# Patient Record
Sex: Female | Born: 1970 | Race: Black or African American | Hispanic: No | Marital: Single | Smoking: Never smoker
Health system: Southern US, Community
[De-identification: ages and names within clinical notes are randomized; demographics above are authoritative.]

## PROBLEM LIST (undated history)

## (undated) DIAGNOSIS — I493 Ventricular premature depolarization: Secondary | ICD-10-CM

## (undated) DIAGNOSIS — T8859XA Other complications of anesthesia, initial encounter: Secondary | ICD-10-CM

## (undated) DIAGNOSIS — T4145XA Adverse effect of unspecified anesthetic, initial encounter: Secondary | ICD-10-CM

## (undated) HISTORY — PX: CERVICAL CONE BIOPSY: SUR198

---

## 2012-01-29 LAB — HM PAP SMEAR: HM Pap smear: ABNORMAL

## 2012-01-30 ENCOUNTER — Ambulatory Visit: Payer: Self-pay | Admitting: Internal Medicine

## 2012-02-01 LAB — HM MAMMOGRAPHY: HM Mammogram: NORMAL

## 2012-04-07 ENCOUNTER — Emergency Department: Payer: Self-pay | Admitting: Emergency Medicine
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
## 2015-07-06 ENCOUNTER — Encounter: Payer: Self-pay | Admitting: Internal Medicine

## 2015-07-06 DIAGNOSIS — R002 Palpitations: Secondary | ICD-10-CM | POA: Insufficient documentation

## 2015-07-06 DIAGNOSIS — Z8742 Personal history of other diseases of the female genital tract: Secondary | ICD-10-CM | POA: Insufficient documentation

## 2015-07-06 DIAGNOSIS — N926 Irregular menstruation, unspecified: Secondary | ICD-10-CM | POA: Insufficient documentation

## 2015-08-17 ENCOUNTER — Encounter: Payer: Self-pay | Admitting: Internal Medicine

## 2015-10-31 ENCOUNTER — Encounter: Payer: Self-pay | Admitting: Internal Medicine

## 2016-07-23 ENCOUNTER — Encounter: Payer: Self-pay | Admitting: Internal Medicine

## 2016-07-23 DIAGNOSIS — D251 Intramural leiomyoma of uterus: Secondary | ICD-10-CM | POA: Insufficient documentation

## 2016-07-24 ENCOUNTER — Encounter: Payer: Self-pay | Admitting: Internal Medicine

## 2016-07-24 ENCOUNTER — Ambulatory Visit (INDEPENDENT_AMBULATORY_CARE_PROVIDER_SITE_OTHER): Payer: Self-pay | Admitting: Internal Medicine

## 2016-07-24 VITALS — BP 118/86 | HR 78 | Resp 16 | Ht 66.25 in | Wt 235.8 lb

## 2016-07-24 DIAGNOSIS — D251 Intramural leiomyoma of uterus: Secondary | ICD-10-CM

## 2016-07-24 NOTE — Patient Instructions (Signed)
DASH Eating Plan  DASH stands for "Dietary Approaches to Stop Hypertension." The DASH eating plan is a healthy eating plan that has been shown to reduce high blood pressure (hypertension). Additional health benefits may include reducing the risk of type 2 diabetes mellitus, heart disease, and stroke. The DASH eating plan may also help with weight loss.  WHAT DO I NEED TO KNOW ABOUT THE DASH EATING PLAN?  For the DASH eating plan, you will follow these general guidelines:  · Choose foods with a percent daily value for sodium of less than 5% (as listed on the food label).  · Use salt-free seasonings or herbs instead of table salt or sea salt.  · Check with your health care provider or pharmacist before using salt substitutes.  · Eat lower-sodium products, often labeled as "lower sodium" or "no salt added."  · Eat fresh foods.  · Eat more vegetables, fruits, and low-fat dairy products.  · Choose whole grains. Look for the word "whole" as the first word in the ingredient list.  · Choose fish and skinless chicken or turkey more often than red meat. Limit fish, poultry, and meat to 6 oz (170 g) each day.  · Limit sweets, desserts, sugars, and sugary drinks.  · Choose heart-healthy fats.  · Limit cheese to 1 oz (28 g) per day.  · Eat more home-cooked food and less restaurant, buffet, and fast food.  · Limit fried foods.  · Cook foods using methods other than frying.  · Limit canned vegetables. If you do use them, rinse them well to decrease the sodium.  · When eating at a restaurant, ask that your food be prepared with less salt, or no salt if possible.  WHAT FOODS CAN I EAT?  Seek help from a dietitian for individual calorie needs.  Grains  Whole grain or whole wheat bread. Brown rice. Whole grain or whole wheat pasta. Quinoa, bulgur, and whole grain cereals. Low-sodium cereals. Corn or whole wheat flour tortillas. Whole grain cornbread. Whole grain crackers. Low-sodium crackers.  Vegetables  Fresh or frozen vegetables  (raw, steamed, roasted, or grilled). Low-sodium or reduced-sodium tomato and vegetable juices. Low-sodium or reduced-sodium tomato sauce and paste. Low-sodium or reduced-sodium canned vegetables.   Fruits  All fresh, canned (in natural juice), or frozen fruits.  Meat and Other Protein Products  Ground beef (85% or leaner), grass-fed beef, or beef trimmed of fat. Skinless chicken or turkey. Ground chicken or turkey. Pork trimmed of fat. All fish and seafood. Eggs. Dried beans, peas, or lentils. Unsalted nuts and seeds. Unsalted canned beans.  Dairy  Low-fat dairy products, such as skim or 1% milk, 2% or reduced-fat cheeses, low-fat ricotta or cottage cheese, or plain low-fat yogurt. Low-sodium or reduced-sodium cheeses.  Fats and Oils  Tub margarines without trans fats. Light or reduced-fat mayonnaise and salad dressings (reduced sodium). Avocado. Safflower, olive, or canola oils. Natural peanut or almond butter.  Other  Unsalted popcorn and pretzels.  The items listed above may not be a complete list of recommended foods or beverages. Contact your dietitian for more options.  WHAT FOODS ARE NOT RECOMMENDED?  Grains  White bread. White pasta. White rice. Refined cornbread. Bagels and croissants. Crackers that contain trans fat.  Vegetables  Creamed or fried vegetables. Vegetables in a cheese sauce. Regular canned vegetables. Regular canned tomato sauce and paste. Regular tomato and vegetable juices.  Fruits  Dried fruits. Canned fruit in light or heavy syrup. Fruit juice.  Meat and Other Protein   Products  Fatty cuts of meat. Ribs, chicken wings, bacon, sausage, bologna, salami, chitterlings, fatback, hot dogs, bratwurst, and packaged luncheon meats. Salted nuts and seeds. Canned beans with salt.  Dairy  Whole or 2% milk, cream, half-and-half, and cream cheese. Whole-fat or sweetened yogurt. Full-fat cheeses or blue cheese. Nondairy creamers and whipped toppings. Processed cheese, cheese spreads, or cheese  curds.  Condiments  Onion and garlic salt, seasoned salt, table salt, and sea salt. Canned and packaged gravies. Worcestershire sauce. Tartar sauce. Barbecue sauce. Teriyaki sauce. Soy sauce, including reduced sodium. Steak sauce. Fish sauce. Oyster sauce. Cocktail sauce. Horseradish. Ketchup and mustard. Meat flavorings and tenderizers. Bouillon cubes. Hot sauce. Tabasco sauce. Marinades. Taco seasonings. Relishes.  Fats and Oils  Butter, stick margarine, lard, shortening, ghee, and bacon fat. Coconut, palm kernel, or palm oils. Regular salad dressings.  Other  Pickles and olives. Salted popcorn and pretzels.  The items listed above may not be a complete list of foods and beverages to avoid. Contact your dietitian for more information.  WHERE CAN I FIND MORE INFORMATION?  National Heart, Lung, and [employer] Institute: www.nhlbi.nih.gov/health/health-topics/topics/dash/     This information is not intended to replace advice given to you by your health care provider. Make sure you discuss any questions you have with your health care provider.     Document Released: 12/06/2011 Document Revised: 01/07/2015 Document Reviewed: 10/21/2013  Elsevier Interactive Patient Education ©2016 Elsevier Inc.

## 2016-07-24 NOTE — Progress Notes (Signed)
    Date:  07/24/2016   Name:  Cynthia Cooley   DOB:  07/25/1971   MRN:  [id number]   Chief Complaint: Tumor (Has tumor in uterus that she wants removed. Was going to do this at [place] through charity care but not in that county so was denied. Patient has abdominal swelling and urinating several times daily because the tumor is pressing on the other organs. )  Findings: The uterus measures 10.9 x 9.7 x 10.2 cm. Again seen is a dominant intramural or subserosal fibroid in the anterior fundus/body measuring 6.9 x 6.0 x 5.6 cm, previously 7.3 x 6.5 x 6.8 cm. The endometrium measures 1.1 cm. No free fluid in the pelvis.  The right ovary measures 3.7 x 1.6 x 2.6 cm and contains a 1.4 cm probable hemorrhagic cyst. Blood flow is documented within the ovary.   The left ovary measures 2.6 x 1.6 x 1.5 cm and is normal in appearance. No ovarian mass seen. Blood flow is documented within the ovary.   Impression: 1. The left ovary is normal in appearance. No mass is seen. 2. Unchanged 6.9 cm intramural or subserosal fibroid in the anterior fundus/body.  Electronically Signed [id number] [place], MD Electronically Signed on:01/20/2016 8:31 AM  Review of Systems  Respiratory: Negative for chest tightness and stridor.   Gastrointestinal: Positive for abdominal distention.  Genitourinary: Positive for frequency, urgency and vaginal bleeding (regular menses). Negative for menstrual problem and pelvic pain.    Patient Active Problem List   Diagnosis Date Noted  . Uterine fibroid 07/23/2016  . Awareness of heartbeats 07/06/2015  . H/O abnormal cervical Papanicolaou smear 07/06/2015  . Abnormal menses 07/06/2015    Prior to Admission medications   Not on File    No Known Allergies  Past Surgical History:  Procedure Laterality Date  . CERVICAL CONE BIOPSY  1989, 2005    Social History  Substance Use Topics  . Smoking status: Never Smoker  . Smokeless tobacco: Not on file  . Alcohol  use No     Medication list has been reviewed and updated.   Physical Exam  Constitutional: She is oriented to person, place, and time. She appears well-developed. No distress.  HENT:  Head: Normocephalic and atraumatic.  Cardiovascular: Normal rate, regular rhythm and normal heart sounds.   Pulmonary/Chest: Effort normal and breath sounds normal. No respiratory distress.  Abdominal: Soft. Bowel sounds are normal. She exhibits no distension and no mass. There is no tenderness. There is no rebound and no guarding.  Musculoskeletal: Normal range of motion. She exhibits no edema or tenderness.  Neurological: She is alert and oriented to person, place, and time.  Skin: Skin is warm and dry. No rash noted.  Psychiatric: She has a normal mood and affect. Her behavior is normal. Thought content normal.  Nursing note and vitals reviewed.   BP 118/86 (BP Location: Right Arm, Patient Position: Sitting, Cuff Size: Large)   Pulse 78   Resp 16   Ht 5' 6.25" (1.683 m)   Wt 235 lb 12.8 oz (107 kg)   LMP 06/23/2016 (Approximate)   SpO2 99%   BMI 37.77 kg/m   Assessment and Plan: 1. Intramural leiomyoma of uterus With pelvic pressure and urinary sx - Ambulatory referral to Gynecology   Halina Maidens, MD [facility]  07/24/2016

## 2017-02-28 ENCOUNTER — Other Ambulatory Visit: Payer: Self-pay | Admitting: Obstetrics & Gynecology

## 2017-02-28 DIAGNOSIS — D251 Intramural leiomyoma of uterus: Secondary | ICD-10-CM

## 2017-03-08 ENCOUNTER — Other Ambulatory Visit: Payer: Self-pay

## 2017-03-08 ENCOUNTER — Ambulatory Visit: Payer: Self-pay | Admitting: Obstetrics & Gynecology

## 2017-04-30 ENCOUNTER — Ambulatory Visit (INDEPENDENT_AMBULATORY_CARE_PROVIDER_SITE_OTHER): Payer: Self-pay

## 2017-04-30 ENCOUNTER — Telehealth: Payer: Self-pay

## 2017-04-30 DIAGNOSIS — D251 Intramural leiomyoma of uterus: Secondary | ICD-10-CM

## 2017-04-30 NOTE — Telephone Encounter (Signed)
Pt transferred to me by S Paschal. Pt states she had an apt a few wks ago that she had to cancel d/t work. She is scheduled for u/s & f/u w/[facility] on 05/20/17, but she started having pain in her uterus on Saturday that has progressively gotten worse. She believes it is coming from her fibroid. She isn't bleeding, but the pain is causing her to have difficulty walking & standing & is causing back pain too. It is bad enough that she may have to call out of work. Pt requesting if there is any way she can have an u/s today to get this checked out?

## 2017-04-30 NOTE — Telephone Encounter (Signed)
Pt calling.  She has a fibroid that she said the doctor didn't want to take out when she saw him last year.  PH's not does not state this.  If fact she was suppose to return in 2wks for u/s and f/u visit which she cancelled.  Pt had appts sched 03/08/17 which was also cancelled.  She states it is time to get this out of her.  Her qualify of life has changed b/c of it - not feeling good at all, has a lot of pulling to the right and downward, back hurts.  Left detailed msg that I wasn't able to pull any strings to get pt in sooner.  We do have a cancellation list however the lady in charge of it is out of the office this pm and will be back tomorrow.  Pt can call tomorrow to be sure she is on cancellation list.

## 2017-04-30 NOTE — Telephone Encounter (Signed)
Called pt. Notified we have a 2:30 slot for U/S today & ABC will review results & consult w/SDJ if needed & discuss w/pt. Per pt, she is in [place] & will have to see if her [facility] will let her off. She asked if we have anything 1st thing in the morning. No opening til noon tomorrow. Offered 4:30 today also. Pt will call us back to let us know if she will be able to make that apt today. 4:30 U/S today placed on hold.

## 2017-04-30 NOTE — Telephone Encounter (Signed)
Per ABC, she reviewed U/S from today. No change in Fibroid. Message to [facility] for his review.

## 2017-05-01 NOTE — Telephone Encounter (Signed)
Spoke w/pt. Notified of [place] comments. Pt will discuss at 05/20/17 visit.

## 2017-05-01 NOTE — Telephone Encounter (Signed)
Same size fibroid as last year, may be 'degenerating' thus causing pain, not an emergency and will see patient as scheduled.  (no repeat US needed that day, just a visit).

## 2017-05-20 ENCOUNTER — Other Ambulatory Visit: Payer: Medicaid Other

## 2017-05-20 ENCOUNTER — Ambulatory Visit: Payer: Self-pay | Admitting: Obstetrics & Gynecology

## 2017-06-24 ENCOUNTER — Encounter: Payer: Self-pay | Admitting: Obstetrics & Gynecology

## 2017-06-24 ENCOUNTER — Ambulatory Visit (INDEPENDENT_AMBULATORY_CARE_PROVIDER_SITE_OTHER): Payer: Self-pay | Admitting: Obstetrics & Gynecology

## 2017-06-24 VITALS — BP 120/80 | HR 83 | Ht 66.0 in | Wt 240.0 lb

## 2017-06-24 DIAGNOSIS — D251 Intramural leiomyoma of uterus: Secondary | ICD-10-CM

## 2017-06-24 DIAGNOSIS — R102 Pelvic and perineal pain: Secondary | ICD-10-CM

## 2017-06-24 NOTE — Progress Notes (Signed)
PRE-OPERATIVE HISTORY AND PHYSICAL EXAM  HPI:  Cynthia Cooley is a 46 y.o. [id number] Patient's last menstrual period was 06/22/2017.; she is being admitted for surgery related to fibroids and pelvic pain. 7cm fibroid by [place], increasing in pain and pressure.  PMHx: History reviewed. No pertinent past medical history. Past Surgical History:  Procedure Laterality Date  . CERVICAL CONE BIOPSY  1989, 2005   Family History  Problem Relation Age of Onset  . Cancer Mother   . Diabetes Mother   . Hyperlipidemia Mother   . Glaucoma Father    Social History  Substance Use Topics  . Smoking status: Never Smoker  . Smokeless tobacco: Never Used  . Alcohol use No   No outpatient prescriptions have been marked as taking for the 06/24/17 encounter (Office Visit) with Gae Dry, MD.   Allergies: Patient has no known allergies.  Review of Systems  Constitutional: Negative for chills, fever and malaise/fatigue.  HENT: Negative for congestion, sinus pain and sore throat.   Eyes: Negative for blurred vision and pain.  Respiratory: Negative for cough and wheezing.   Cardiovascular: Negative for chest pain and leg swelling.  Gastrointestinal: Negative for abdominal pain, constipation, diarrhea, heartburn, nausea and vomiting.  Genitourinary: Negative for dysuria, frequency, hematuria and urgency.  Musculoskeletal: Negative for back pain, joint pain, myalgias and neck pain.  Skin: Negative for itching and rash.  Neurological: Negative for dizziness, tremors and weakness.  Endo/Heme/Allergies: Does not bruise/bleed easily.  Psychiatric/Behavioral: Negative for depression. The patient is not nervous/anxious and does not have insomnia.     Objective: BP 120/80   Pulse 83   Ht 5\' 6"  (1.676 m)   Wt 240 lb (108.9 kg)   LMP 06/22/2017   BMI 38.74 kg/m   Filed Weights   06/24/17 0817  Weight: 240 lb (108.9 kg)   Physical Exam  Constitutional: She is oriented to person, place, and  time. She appears well-developed and well-nourished. No distress.  Genitourinary: Rectum normal, vagina normal and uterus normal. Pelvic exam was performed with patient supine. There is no rash or lesion on the right labia. There is no rash or lesion on the left labia. Vagina exhibits no lesion. No bleeding in the vagina. Right adnexum does not display mass and does not display tenderness. Left adnexum does not display mass and does not display tenderness. Cervix does not exhibit motion tenderness, lesion, friability or polyp.   Uterus is mobile and midaxial. Uterus is not enlarged or exhibiting a mass.  Genitourinary Comments: 8 week size uterus  HENT:  Head: Normocephalic and atraumatic. Head is without laceration.  Right Ear: Hearing normal.  Left Ear: Hearing normal.  Nose: No epistaxis.  No foreign bodies.  Mouth/Throat: Uvula is midline, oropharynx is clear and moist and mucous membranes are normal.  Eyes: Pupils are equal, round, and reactive to light.  Neck: Normal range of motion. Neck supple. No thyromegaly present.  Cardiovascular: Normal rate and regular rhythm.  Exam reveals no gallop and no friction rub.   No murmur heard. Pulmonary/Chest: Effort normal and breath sounds normal. No respiratory distress. She has no wheezes. Right breast exhibits no mass, no skin change and no tenderness. Left breast exhibits no mass, no skin change and no tenderness.  Abdominal: Soft. Bowel sounds are normal. She exhibits no distension. There is no tenderness. There is no rebound.  Musculoskeletal: Normal range of motion.  Neurological: She is alert and oriented to person, place, and time.  No cranial nerve deficit.  Skin: Skin is warm and dry.  Psychiatric: She has a normal mood and affect. Judgment normal.  Vitals reviewed.   Assessment: 1. Intramural leiomyoma of uterus   2. Acute pelvic pain, female   Plan and desire forTLH, BS (preservation of ovaries). Options of myomectomy, [place], and  Lupron discussed.  I have had a careful discussion with this patient about all the options available and the risk/benefits of each. I have fully informed this patient that surgery may subject her to a variety of discomforts and risks: She understands that most patients have surgery with little difficulty, but problems can happen ranging from minor to fatal. These include nausea, vomiting, pain, bleeding, infection, poor healing, hernia, or formation of adhesions. Unexpected reactions may occur from any drug or anesthetic given. Unintended injury may occur to other pelvic or abdominal structures such as Fallopian tubes, ovaries, bladder, ureter (tube from kidney to bladder), or bowel. Nerves going from the pelvis to the legs may be injured. Any such injury may require immediate or later additional surgery to correct the problem. Excessive blood loss requiring transfusion is very unlikely but possible. Dangerous blood clots may form in the legs or lungs. Physical and sexual activity will be restricted in varying degrees for an indeterminate period of time but most often 2-6 weeks.  Finally, she understands that it is impossible to list every possible undesirable effect and that the condition for which surgery is done is not always cured or significantly improved, and in rare cases may be even worse.Ample time was given to answer all questions.  Barnett Applebaum, MD, [facility], [facility] 06/24/2017  11:58 AM

## 2017-06-24 NOTE — Patient Instructions (Signed)
Total Laparoscopic Hysterectomy °A total laparoscopic hysterectomy is a minimally invasive surgery to remove your uterus and cervix. This surgery is performed by making several small cuts (incisions) in your abdomen. It can also be done with a thin, lighted tube (laparoscope) inserted into two small incisions in your lower abdomen. Your fallopian tubes and ovaries can be removed (bilateral salpingo-oophorectomy) during this surgery as well. Benefits of minimally invasive surgery include: °· Less pain. °· Less risk of blood loss. °· Less risk of infection. °· Quicker return to normal activities. ° °Tell a health care provider about: °· Any allergies you have. °· All medicines you are taking, including vitamins, herbs, eye drops, creams, and over-the-counter medicines. °· Any problems you or family members have had with anesthetic medicines. °· Any blood disorders you have. °· Any surgeries you have had. °· Any medical conditions you have. °What are the risks? °Generally, this is a safe procedure. However, as with any procedure, complications can occur. Possible complications include: °· Bleeding. °· Blood clots in the legs or lung. °· Infection. °· Injury to surrounding organs. °· Problems with anesthesia. °· Early menopause symptoms (hot flashes, night sweats, insomnia). °· Risk of conversion to an open abdominal incision. ° °What happens before the procedure? °· Ask your health care provider about changing or stopping your regular medicines. °· Do not take aspirin or blood thinners (anticoagulants) for 1 week before the surgery or as told by your health care provider. °· Do not eat or drink anything for 8 hours before the surgery or as told by your health care provider. °· Quit smoking if you smoke. °· Arrange for a ride home after surgery and for someone to help you at home during recovery. °What happens during the procedure? °· You will be given antibiotic medicine. °· An IV tube will be placed in your arm. You  will be given medicine to make you sleep (general anesthetic). °· A gas (carbon dioxide) will be used to inflate your abdomen. This will allow your surgeon to look inside your abdomen, perform your surgery, and treat any other problems found if necessary. °· Three or four small incisions (often less than 1/2 inch) will be made in your abdomen. One of these incisions will be made in the area of your belly button (navel). The laparoscope will be inserted into the incision. Your surgeon will look through the laparoscope while doing your procedure. °· Other surgical instruments will be inserted through the other incisions. °· Your uterus may be removed through your vagina or cut into small pieces and removed through the small incisions. °· Your incisions will be closed. °What happens after the procedure? °· The gas will be released from inside your abdomen. °· You will be taken to the recovery area where a nurse will watch and check your progress. Once you are awake, stable, and taking fluids well, without other problems, you will return to your room or be allowed to go home. °· There is usually minimal discomfort following the surgery because the incisions are so small. °· You will be given pain medicine while you are in the hospital and for when you go home. °This information is not intended to replace advice given to you by your health care provider. Make sure you discuss any questions you have with your health care provider. °Document Released: 10/14/2007 Document Revised: 05/24/2016 Document Reviewed: 07/07/2013 °Elsevier Interactive Patient Education © 2017 Elsevier Inc. ° °

## 2017-06-24 NOTE — Progress Notes (Signed)
HPI: Uterine Fibroids Patient presents with uterine fibroids. Periods are regular every 28-30 days, lasting 5 days. Dysmenorrhea:severe, occurring throughout menses. Cyclic symptoms include bloating. No intermenstrual bleeding, spotting, or discharge.  Ultrasound demonstrates 7 cm fibroid These findings are Pelvis abnormal as fibroidis present and attributal to sx's  PMHx: She  has no past medical history on file. Also,  has a past surgical history that includes Cervical cone biopsy (1989, 2005)., family history includes Cancer in her mother; Diabetes in her mother; Glaucoma in her father; Hyperlipidemia in her mother.,  reports that she has never smoked. She has never used smokeless tobacco. She reports that she does not drink alcohol or use drugs.  She currently has no medications in their medication list. Also, has No Known Allergies.  Review of Systems  Constitutional: Negative for chills, fever and malaise/fatigue.  HENT: Negative for congestion, sinus pain and sore throat.   Eyes: Negative for blurred vision and pain.  Respiratory: Negative for cough and wheezing.   Cardiovascular: Negative for chest pain and leg swelling.  Gastrointestinal: Negative for abdominal pain, constipation, diarrhea, heartburn, nausea and vomiting.  Genitourinary: Negative for dysuria, frequency, hematuria and urgency.  Musculoskeletal: Negative for back pain, joint pain, myalgias and neck pain.  Skin: Negative for itching and rash.  Neurological: Negative for dizziness, tremors and weakness.  Endo/Heme/Allergies: Does not bruise/bleed easily.  Psychiatric/Behavioral: Negative for depression. The patient is not nervous/anxious and does not have insomnia.    Objective: BP 120/80   Pulse 83   Ht 5\' 6"  (1.676 m)   Wt 240 lb (108.9 kg)   LMP 06/22/2017   BMI 38.74 kg/m   Physical examination Constitutional NAD, Conversant  Skin No rashes, lesions or ulceration.   Extremities: Moves all  appropriately.  Normal ROM for age. No lymphadenopathy.  Neuro: Grossly intact  Psych: Oriented to PPT.  Normal mood. Normal affect.   Assessment:  Intramural leiomyoma of uterus  Options of myomectomy, [place], Lupron, hormonal tx of sx's all discussed w patient. Info gv.  She desires to have TLH, BS done w preservation of ovaries.  I have had a careful discussion with this patient about all the options available and the risk/benefits of each. I have fully informed this patient that a hysterectomy may subject her to a variety of discomforts and risks: She understands that most patients have surgery with little difficulty, but problems can happen ranging from minor to fatal. These include nausea, vomiting, pain, bleeding, infection, poor healing, hernia, or formation of adhesions. Unexpected reactions may occur from any drug or anesthetic given. Unintended injury may occur to other pelvic or abdominal structures such as Fallopian tubes, ovaries, bladder, ureter (tube from kidney to bladder), or bowel. Nerves going from the pelvis to the legs may be injured. Any such injury may require immediate or later additional surgery to correct the problem. Excessive blood loss requiring transfusion is very unlikely but possible. Dangerous blood clots may form in the legs or lungs. Physical and sexual activity will be restricted in varying degrees for an indeterminate period of time but most often 2-4 weeks. She understands that the plan is to do this laparoscopically, however, there is a chance that this will need to be performed via a larger incision. She will most likely be hospitalized overnight. Finally, she understands that it is impossible to list every possible undesirable effect and that the condition for which surgery is done is not always cured or significantly improved, and in rare cases may be  even worse. I have also counseled her extensively about the pros and cons of ovarian conservation versus removal. Ample  time was given to answer all questions.  Barnett Applebaum, MD, [facility], [facility] 06/24/2017  8:44 AM

## 2017-06-24 NOTE — Addendum Note (Signed)
Addended by: Gae Dry on: 06/24/2017 12:00 PM   Modules accepted: Orders, SmartSet

## 2017-07-17 ENCOUNTER — Encounter
Admission: RE | Admit: 2017-07-17 | Discharge: 2017-07-17 | Disposition: A | Discharge status: Home / Self Care | Payer: Self-pay | Source: Ambulatory Visit | Attending: Obstetrics & Gynecology | Admitting: Obstetrics & Gynecology

## 2017-07-17 HISTORY — DX: Other complications of anesthesia, initial encounter: T88.59XA

## 2017-07-17 HISTORY — DX: Adverse effect of unspecified anesthetic, initial encounter: T41.45XA

## 2017-07-17 HISTORY — DX: Ventricular premature depolarization: I49.3

## 2017-07-17 NOTE — Patient Instructions (Signed)
  Your procedure is scheduled on: 07-23-17 TUESDAY Report to Same Day Surgery 2nd floor medical mall [facility] Entrance-take elevator on left to 2nd floor.  Check in with surgery information desk.) To find out your arrival time please call [phone] between 1PM - 3PM on 07-22-17 MONDAY  Remember: Instructions that are not followed completely may result in serious medical risk, up to and including death, or upon the discretion of your surgeon and anesthesiologist your surgery may need to be rescheduled.    _x___ 1. Do not eat food or drink liquids after midnight. No gum chewing or hard candies.     __x__ 2. No Alcohol for 24 hours before or after surgery.   __x__3. No Smoking for 24 prior to surgery.   ____  4. Bring all medications with you on the day of surgery if instructed.    __x__ 5. Notify your doctor if there is any change in your medical condition     (cold, fever, infections).     Do not wear jewelry, make-up, hairpins, clips or nail polish.  Do not wear lotions, powders, or perfumes. You may wear deodorant.  Do not shave 48 hours prior to surgery. Men may shave face and neck.  Do not bring valuables to the hospital.    [facility] is not responsible for any belongings or valuables.               Contacts, dentures or bridgework may not be worn into surgery.  Leave your suitcase in the car. After surgery it may be brought to your room.  For patients admitted to the hospital, discharge time is determined by your treatment team.   Patients discharged the day of surgery will not be allowed to drive home.  You will need someone to drive you home and stay with you the night of your procedure.    Please read over the following fact sheets that you were given:     ____ Take anti-hypertensive (unless it includes a diuretic), cardiac, seizure, asthma,     anti-reflux and psychiatric medicines. These include:  1. NONE  2.  3.  4.  5.  6.  ____Fleets enema or Magnesium  Citrate as directed.   _x___ Use CHG Soap or sage wipes as directed on instruction sheet   ____ Use inhalers on the day of surgery and bring to hospital day of surgery  ____ Stop Metformin and Janumet 2 days prior to surgery.    ____ Take 1/2 of usual insulin dose the night before surgery and none on the morning surgery.   ____ Follow recommendations from Cardiologist, Pulmonologist or PCP regarding  stopping Aspirin, Coumadin, Pllavix ,Eliquis, Effient, or Pradaxa, and Pletal.  X____Stop Anti-inflammatories such as Advil, Aleve, Ibuprofen, Motrin, Naproxen, Naprosyn, Goodies powders or aspirin products NOW- OK to take Tylenol .   ____ Stop supplements until after surgery.     ____ Bring C-Pap to the hospital.

## 2017-07-22 ENCOUNTER — Encounter
Admission: RE | Admit: 2017-07-22 | Discharge: 2017-07-22 | Disposition: A | Discharge status: Home / Self Care | Payer: Medicaid Other | Source: Ambulatory Visit | Attending: Obstetrics & Gynecology | Admitting: Obstetrics & Gynecology

## 2017-07-22 DIAGNOSIS — R102 Pelvic and perineal pain: Secondary | ICD-10-CM | POA: Insufficient documentation

## 2017-07-22 DIAGNOSIS — Z01812 Encounter for preprocedural laboratory examination: Secondary | ICD-10-CM | POA: Insufficient documentation

## 2017-07-22 DIAGNOSIS — D251 Intramural leiomyoma of uterus: Secondary | ICD-10-CM | POA: Diagnosis not present

## 2017-07-22 LAB — CBC
HEMATOCRIT: 36.1 % (ref 35.0–47.0)
Hemoglobin: 12.2 g/dL (ref 12.0–16.0)
MCH: 29.3 pg (ref 26.0–34.0)
MCHC: 33.9 g/dL (ref 32.0–36.0)
MCV: 86.5 fL (ref 80.0–100.0)
Platelets: 188 10*3/uL (ref 150–440)
RBC: 4.17 MIL/uL (ref 3.80–5.20)
RDW: 13.9 % (ref 11.5–14.5)
WBC: 2.3 10*3/uL — AB (ref 3.6–11.0)

## 2017-07-22 MED ORDER — CEFOXITIN SODIUM-DEXTROSE 2-2.2 GM-% IV SOLR (PREMIX)
2.0000 g | INTRAVENOUS | Status: AC
  Administered 2017-07-23: 2 g via INTRAVENOUS

## 2017-07-22 NOTE — Pre-Procedure Instructions (Signed)
Spoke with Ginger Carne RN about WBC of 2.3.. She said Anesthesia would not have a problem with that #. Faxed the preop labs to Dr Kenton Kingfisher' office.  Izora Gala called and said that Dr Kenton Kingfisher is ok with WBC of 2.3

## 2017-07-23 ENCOUNTER — Ambulatory Visit: Payer: Medicaid Other | Admitting: Anesthesiology

## 2017-07-23 ENCOUNTER — Encounter
Admission: RE | Disposition: A | Discharge status: Home / Self Care | Payer: Self-pay | Source: Ambulatory Visit | Attending: Obstetrics & Gynecology

## 2017-07-23 ENCOUNTER — Observation Stay
Admission: RE | Admit: 2017-07-23 | Discharge: 2017-07-24 | Disposition: A | Discharge status: Home / Self Care | Payer: Medicaid Other | Source: Ambulatory Visit | Attending: Obstetrics & Gynecology | Admitting: Obstetrics & Gynecology

## 2017-07-23 DIAGNOSIS — I493 Ventricular premature depolarization: Secondary | ICD-10-CM | POA: Insufficient documentation

## 2017-07-23 DIAGNOSIS — Z884 Allergy status to anesthetic agent status: Secondary | ICD-10-CM | POA: Diagnosis not present

## 2017-07-23 DIAGNOSIS — D250 Submucous leiomyoma of uterus: Secondary | ICD-10-CM

## 2017-07-23 DIAGNOSIS — N946 Dysmenorrhea, unspecified: Secondary | ICD-10-CM | POA: Insufficient documentation

## 2017-07-23 DIAGNOSIS — D259 Leiomyoma of uterus, unspecified: Secondary | ICD-10-CM | POA: Diagnosis present

## 2017-07-23 DIAGNOSIS — R51 Headache: Secondary | ICD-10-CM | POA: Diagnosis not present

## 2017-07-23 DIAGNOSIS — D252 Subserosal leiomyoma of uterus: Secondary | ICD-10-CM | POA: Diagnosis not present

## 2017-07-23 DIAGNOSIS — Z6838 Body mass index (BMI) 38.0-38.9, adult: Secondary | ICD-10-CM | POA: Diagnosis not present

## 2017-07-23 DIAGNOSIS — D251 Intramural leiomyoma of uterus: Secondary | ICD-10-CM | POA: Diagnosis not present

## 2017-07-23 DIAGNOSIS — N926 Irregular menstruation, unspecified: Secondary | ICD-10-CM | POA: Diagnosis present

## 2017-07-23 HISTORY — PX: CYSTOSCOPY: SHX5120

## 2017-07-23 HISTORY — PX: LAPAROSCOPIC HYSTERECTOMY: SHX1926

## 2017-07-23 LAB — POCT PREGNANCY, URINE: PREG TEST UR: NEGATIVE

## 2017-07-23 SURGERY — HYSTERECTOMY, TOTAL, LAPAROSCOPIC
Anesthesia: General | Wound class: Clean Contaminated

## 2017-07-23 MED ORDER — LACTATED RINGERS IR SOLN
Status: DC | PRN
  Administered 2017-07-23: 200 mL

## 2017-07-23 MED ORDER — ACETAMINOPHEN 325 MG PO TABS: 650.0000 mg | ORAL_TABLET | ORAL | Status: DC | PRN

## 2017-07-23 MED ORDER — SUGAMMADEX SODIUM 500 MG/5ML IV SOLN
INTRAVENOUS | Status: AC
  Filled 2017-07-23: qty 5

## 2017-07-23 MED ORDER — FENTANYL CITRATE (PF) 100 MCG/2ML IJ SOLN
INTRAMUSCULAR | Status: AC
  Filled 2017-07-23: qty 2

## 2017-07-23 MED ORDER — DEXAMETHASONE SODIUM PHOSPHATE 10 MG/ML IJ SOLN
INTRAMUSCULAR | Status: AC
  Filled 2017-07-23: qty 1

## 2017-07-23 MED ORDER — OXYCODONE-ACETAMINOPHEN 5-325 MG PO TABS
ORAL_TABLET | ORAL | Status: AC
  Administered 2017-07-23: 1 via ORAL
  Filled 2017-07-23: qty 1

## 2017-07-23 MED ORDER — LACTATED RINGERS IV SOLN: Freq: Once | INTRAVENOUS | Status: DC

## 2017-07-23 MED ORDER — ONDANSETRON HCL 4 MG/2ML IJ SOLN
4.0000 mg | Freq: Four times a day (QID) | INTRAMUSCULAR | Status: DC | PRN
  Filled 2017-07-23: qty 2

## 2017-07-23 MED ORDER — FENTANYL CITRATE (PF) 100 MCG/2ML IJ SOLN
25.0000 ug | INTRAMUSCULAR | Status: DC | PRN
  Administered 2017-07-23 (×4): 25 ug via INTRAVENOUS

## 2017-07-23 MED ORDER — OXYCODONE-ACETAMINOPHEN 5-325 MG PO TABS
1.0000 | ORAL_TABLET | Freq: Four times a day (QID) | ORAL | Status: DC | PRN
  Administered 2017-07-23 (×2): 1 via ORAL

## 2017-07-23 MED ORDER — PROPOFOL 10 MG/ML IV BOLUS
INTRAVENOUS | Status: AC
  Filled 2017-07-23: qty 20

## 2017-07-23 MED ORDER — PROMETHAZINE HCL 25 MG/ML IJ SOLN: 6.2500 mg | INTRAMUSCULAR | Status: DC | PRN

## 2017-07-23 MED ORDER — MIDAZOLAM HCL 2 MG/2ML IJ SOLN
INTRAMUSCULAR | Status: DC | PRN
  Administered 2017-07-23: 2 mg via INTRAVENOUS

## 2017-07-23 MED ORDER — SOD CITRATE-CITRIC ACID 500-334 MG/5ML PO SOLN: 30.0000 mL | ORAL | Status: DC

## 2017-07-23 MED ORDER — OXYCODONE-ACETAMINOPHEN 5-325 MG PO TABS: 1.0000 | ORAL_TABLET | Freq: Four times a day (QID) | ORAL | 0 refills | Status: AC | PRN

## 2017-07-23 MED ORDER — ROCURONIUM BROMIDE 100 MG/10ML IV SOLN
INTRAVENOUS | Status: DC | PRN
Start: 2017-07-23 — End: 2017-07-23
  Administered 2017-07-23: 10 mg via INTRAVENOUS
  Administered 2017-07-23: 50 mg via INTRAVENOUS

## 2017-07-23 MED ORDER — ACETAMINOPHEN 650 MG RE SUPP
650.0000 mg | RECTAL | Status: DC | PRN
  Filled 2017-07-23: qty 1

## 2017-07-23 MED ORDER — PROPOFOL 10 MG/ML IV BOLUS
INTRAVENOUS | Status: DC | PRN
  Administered 2017-07-23: 200 mg via INTRAVENOUS

## 2017-07-23 MED ORDER — FAMOTIDINE 20 MG PO TABS
ORAL_TABLET | ORAL | Status: AC
  Administered 2017-07-23: 20 mg via ORAL
  Filled 2017-07-23: qty 1

## 2017-07-23 MED ORDER — MORPHINE SULFATE (PF) 4 MG/ML IV SOLN
1.0000 mg | INTRAVENOUS | Status: DC | PRN
Start: 2017-07-23 — End: 2017-07-23

## 2017-07-23 MED ORDER — FLUORESCEIN SODIUM 10 % IV SOLN
INTRAVENOUS | Status: DC | PRN
  Administered 2017-07-23: 50 mg via INTRAVENOUS

## 2017-07-23 MED ORDER — FAMOTIDINE 20 MG PO TABS
20.0000 mg | ORAL_TABLET | Freq: Once | ORAL | Status: AC
  Administered 2017-07-23: 20 mg via ORAL

## 2017-07-23 MED ORDER — BISACODYL 10 MG RE SUPP: 10.0000 mg | Freq: Every day | RECTAL | Status: DC | PRN

## 2017-07-23 MED ORDER — SENNOSIDES-DOCUSATE SODIUM 8.6-50 MG PO TABS: 1.0000 | ORAL_TABLET | Freq: Every evening | ORAL | Status: DC | PRN

## 2017-07-23 MED ORDER — ONDANSETRON HCL 4 MG PO TABS
4.0000 mg | ORAL_TABLET | Freq: Four times a day (QID) | ORAL | Status: DC | PRN
  Administered 2017-07-23: 4 mg via ORAL
  Filled 2017-07-23: qty 1

## 2017-07-23 MED ORDER — SUGAMMADEX SODIUM 500 MG/5ML IV SOLN
INTRAVENOUS | Status: DC | PRN
  Administered 2017-07-23: 220 mg via INTRAVENOUS

## 2017-07-23 MED ORDER — ACETAMINOPHEN NICU IV SYRINGE 10 MG/ML
INTRAVENOUS | Status: AC
  Filled 2017-07-23: qty 1

## 2017-07-23 MED ORDER — DEXAMETHASONE SODIUM PHOSPHATE 10 MG/ML IJ SOLN
INTRAMUSCULAR | Status: DC | PRN
  Administered 2017-07-23: 10 mg via INTRAVENOUS

## 2017-07-23 MED ORDER — FLUORESCEIN SODIUM 10 % IV SOLN
INTRAVENOUS | Status: AC
  Filled 2017-07-23: qty 5

## 2017-07-23 MED ORDER — ONDANSETRON HCL 4 MG/2ML IJ SOLN
INTRAMUSCULAR | Status: DC | PRN
  Administered 2017-07-23: 4 mg via INTRAVENOUS

## 2017-07-23 MED ORDER — OXYCODONE-ACETAMINOPHEN 5-325 MG PO TABS
1.0000 | ORAL_TABLET | ORAL | Status: DC | PRN
  Administered 2017-07-24: 1 via ORAL
  Filled 2017-07-23 (×2): qty 2

## 2017-07-23 MED ORDER — ROCURONIUM BROMIDE 50 MG/5ML IV SOLN
INTRAVENOUS | Status: AC
  Filled 2017-07-23: qty 1

## 2017-07-23 MED ORDER — ONDANSETRON HCL 4 MG/2ML IJ SOLN
INTRAMUSCULAR | Status: AC
  Filled 2017-07-23: qty 2

## 2017-07-23 MED ORDER — SIMETHICONE 80 MG PO CHEW: 80.0000 mg | CHEWABLE_TABLET | Freq: Four times a day (QID) | ORAL | Status: DC | PRN

## 2017-07-23 MED ORDER — SUCCINYLCHOLINE CHLORIDE 20 MG/ML IJ SOLN
INTRAMUSCULAR | Status: DC | PRN
  Administered 2017-07-23: 120 mg via INTRAVENOUS

## 2017-07-23 MED ORDER — MIDAZOLAM HCL 2 MG/2ML IJ SOLN
INTRAMUSCULAR | Status: AC
  Filled 2017-07-23: qty 2

## 2017-07-23 MED ORDER — DOCUSATE SODIUM 100 MG PO CAPS
100.0000 mg | ORAL_CAPSULE | Freq: Two times a day (BID) | ORAL | Status: DC
  Administered 2017-07-24: 100 mg via ORAL
  Filled 2017-07-23 (×2): qty 1

## 2017-07-23 MED ORDER — KETOROLAC TROMETHAMINE 30 MG/ML IJ SOLN
INTRAMUSCULAR | Status: DC | PRN
  Administered 2017-07-23: 30 mg via INTRAVENOUS

## 2017-07-23 MED ORDER — SUCCINYLCHOLINE CHLORIDE 20 MG/ML IJ SOLN
INTRAMUSCULAR | Status: AC
  Filled 2017-07-23: qty 1

## 2017-07-23 MED ORDER — ACETAMINOPHEN 10 MG/ML IV SOLN
INTRAVENOUS | Status: DC | PRN
  Administered 2017-07-23: 1000 mg via INTRAVENOUS

## 2017-07-23 MED ORDER — LACTATED RINGERS IV SOLN
INTRAVENOUS | Status: DC
  Administered 2017-07-23 (×2): via INTRAVENOUS

## 2017-07-23 MED ORDER — LACTATED RINGERS IV SOLN: INTRAVENOUS | Status: DC

## 2017-07-23 MED ORDER — KETOROLAC TROMETHAMINE 30 MG/ML IJ SOLN: 30.0000 mg | Freq: Four times a day (QID) | INTRAMUSCULAR | Status: DC

## 2017-07-23 MED ORDER — FENTANYL CITRATE (PF) 100 MCG/2ML IJ SOLN
INTRAMUSCULAR | Status: DC | PRN
  Administered 2017-07-23 (×4): 50 ug via INTRAVENOUS

## 2017-07-23 MED ORDER — KETOROLAC TROMETHAMINE 30 MG/ML IJ SOLN
30.0000 mg | Freq: Four times a day (QID) | INTRAMUSCULAR | Status: DC
  Administered 2017-07-23 – 2017-07-24 (×2): 30 mg via INTRAVENOUS
  Filled 2017-07-23 (×3): qty 1

## 2017-07-23 MED ORDER — LIDOCAINE HCL (PF) 2 % IJ SOLN
INTRAMUSCULAR | Status: AC
  Filled 2017-07-23: qty 2

## 2017-07-23 MED ORDER — MORPHINE SULFATE (PF) 2 MG/ML IV SOLN: 1.0000 mg | INTRAVENOUS | Status: DC | PRN

## 2017-07-23 MED ORDER — LACTATED RINGERS IR SOLN
Status: DC | PRN
  Administered 2017-07-23: 800 mL

## 2017-07-23 MED ORDER — CEFOXITIN SODIUM-DEXTROSE 2-2.2 GM-% IV SOLR (PREMIX)
INTRAVENOUS | Status: AC
  Filled 2017-07-23: qty 50

## 2017-07-23 SURGICAL SUPPLY — 53 items
BAG URO DRAIN 2000ML W/SPOUT (MISCELLANEOUS) ×4 IMPLANT
BLADE SURG SZ11 CARB STEEL (BLADE) ×4 IMPLANT
CANISTER SUCT 1200ML W/VALVE (MISCELLANEOUS) ×4 IMPLANT
CATH FOLEY 2WAY  5CC 16FR (CATHETERS) ×2
CATH URTH 16FR FL 2W BLN LF (CATHETERS) ×2 IMPLANT
CHLORAPREP W/TINT 26ML (MISCELLANEOUS) ×4 IMPLANT
DEFOGGER SCOPE WARMER CLEARIFY (MISCELLANEOUS) ×4 IMPLANT
DERMABOND ADVANCED (GAUZE/BANDAGES/DRESSINGS) ×2
DERMABOND ADVANCED .7 DNX12 (GAUZE/BANDAGES/DRESSINGS) ×2 IMPLANT
DEVICE SUTURE ENDOST 10MM (ENDOMECHANICALS) ×4 IMPLANT
DRAPE CAMERA CLOSED 9X96 (DRAPES) ×4 IMPLANT
DRSG TEGADERM 2-3/8X2-3/4 SM (GAUZE/BANDAGES/DRESSINGS) IMPLANT
ENDOSTITCH 0 SINGLE 48 (SUTURE) ×20 IMPLANT
GAUZE SPONGE NON-WVN 2X2 STRL (MISCELLANEOUS) IMPLANT
GLOVE BIO SURGEON STRL SZ8 (GLOVE) ×20 IMPLANT
GLOVE INDICATOR 8.0 STRL GRN (GLOVE) ×4 IMPLANT
GOWN STRL REUS W/ TWL LRG LVL3 (GOWN DISPOSABLE) ×2 IMPLANT
GOWN STRL REUS W/ TWL XL LVL3 (GOWN DISPOSABLE) ×4 IMPLANT
GOWN STRL REUS W/TWL LRG LVL3 (GOWN DISPOSABLE) ×2
GOWN STRL REUS W/TWL XL LVL3 (GOWN DISPOSABLE) ×4
GRASPER SUT TROCAR 14GX15 (MISCELLANEOUS) ×4 IMPLANT
IRRIGATION STRYKERFLOW (MISCELLANEOUS) ×2 IMPLANT
IRRIGATOR STRYKERFLOW (MISCELLANEOUS) ×4
IV LACTATED RINGERS 1000ML (IV SOLUTION) ×4 IMPLANT
KIT PINK PAD W/HEAD ARE REST (MISCELLANEOUS) ×4
KIT PINK PAD W/HEAD ARM REST (MISCELLANEOUS) ×2 IMPLANT
KIT RM TURNOVER CYSTO AR (KITS) ×4 IMPLANT
LABEL OR SOLS (LABEL) ×4 IMPLANT
MANIPULATOR VCARE LG CRV RETR (MISCELLANEOUS) IMPLANT
MANIPULATOR VCARE SML CRV RETR (MISCELLANEOUS) IMPLANT
MANIPULATOR VCARE STD CRV RETR (MISCELLANEOUS) ×4 IMPLANT
MORCELLATOR XCISE  COR (MISCELLANEOUS) ×2
MORCELLATOR XCISE COR (MISCELLANEOUS) ×2 IMPLANT
NEEDLE VERESS 14GA 120MM (NEEDLE) ×4 IMPLANT
NS IRRIG 500ML POUR BTL (IV SOLUTION) ×4 IMPLANT
OCCLUDER COLPOPNEUMO (BALLOONS) ×4 IMPLANT
PACK GYN LAPAROSCOPIC (MISCELLANEOUS) ×4 IMPLANT
PAD OB MATERNITY 4.3X12.25 (PERSONAL CARE ITEMS) ×4 IMPLANT
PAD PREP 24X41 OB/GYN DISP (PERSONAL CARE ITEMS) ×4 IMPLANT
SCISSORS METZENBAUM CVD 33 (INSTRUMENTS) IMPLANT
SET CYSTO W/LG BORE CLAMP LF (SET/KITS/TRAYS/PACK) ×4 IMPLANT
SHEARS HARMONIC ACE PLUS 36CM (ENDOMECHANICALS) ×4 IMPLANT
SLEEVE ENDOPATH XCEL 5M (ENDOMECHANICALS) ×4 IMPLANT
SPONGE VERSALON 2X2 STRL (MISCELLANEOUS)
SUT MNCRL 4-0 (SUTURE) ×2
SUT MNCRL 4-0 27XMFL (SUTURE) ×2
SUT VIC AB 0 CT1 36 (SUTURE) ×4 IMPLANT
SUTURE MNCRL 4-0 27XMF (SUTURE) ×2 IMPLANT
SYR 50ML LL SCALE MARK (SYRINGE) ×4 IMPLANT
SYRINGE 10CC LL (SYRINGE) ×4 IMPLANT
TROCAR ENDO BLADELESS 11MM (ENDOMECHANICALS) ×4 IMPLANT
TROCAR XCEL NON-BLD 5MMX100MML (ENDOMECHANICALS) ×4 IMPLANT
TUBING INSUF HEATED (TUBING) ×4 IMPLANT

## 2017-07-23 NOTE — Transfer of Care (Signed)
Immediate Anesthesia Transfer of Care Note  Patient: Cynthia Cooley  Procedure(s) Performed: Procedure(s): HYSTERECTOMY TOTAL LAPAROSCOPIC BILATERAL SALPINGECTOMY (Bilateral) CYSTOSCOPY (N/A)  Patient Location: PACU  Anesthesia Type:General  Level of Consciousness: awake, alert  and oriented  Airway & Oxygen Therapy: Patient connected to face mask oxygen  Post-op Assessment: Post -op Vital signs reviewed and stable  Post vital signs: stable  Last Vitals:  Vitals:   07/23/17 1240 07/23/17 1241  BP: 131/73 131/73  Pulse: 92 92  Resp: 12 13  Temp: (!) 35.9 C     Last Pain:  Vitals:   07/23/17 0932  TempSrc: Oral         Complications: No apparent anesthesia complications

## 2017-07-23 NOTE — Progress Notes (Signed)
Pt having difficulty moving well and has some pain. Prefers to stay overnight in hospital for initial recovery from her hysterectomy. Will monitor pain sx's.

## 2017-07-23 NOTE — Anesthesia Postprocedure Evaluation (Signed)
Anesthesia Post Note  Patient: Cynthia Cooley  Procedure(s) Performed: Procedure(s) (LRB): HYSTERECTOMY TOTAL LAPAROSCOPIC BILATERAL SALPINGECTOMY (Bilateral) CYSTOSCOPY (N/A)  Patient location during evaluation: PACU Anesthesia Type: General Level of consciousness: awake and alert Pain management: pain level controlled Vital Signs Assessment: post-procedure vital signs reviewed and stable Respiratory status: spontaneous breathing, nonlabored ventilation, respiratory function stable and patient connected to nasal cannula oxygen Cardiovascular status: blood pressure returned to baseline and stable Postop Assessment: no signs of nausea or vomiting Anesthetic complications: no     Last Vitals:  Vitals:   07/23/17 1344 07/23/17 1504  BP: (!) 145/79 (!) 154/84  Pulse: 80 85  Resp: 18 16  Temp: 36.7 C     Last Pain:  Vitals:   07/23/17 1504  TempSrc:   PainSc: 7                  Martha Clan

## 2017-07-23 NOTE — Discharge Instructions (Signed)
Total Laparoscopic Hysterectomy, Care After °Refer to this sheet in the next few weeks. These instructions provide you with information on caring for yourself after your procedure. Your health care provider may also give you more specific instructions. Your treatment has been planned according to current medical practices, but problems sometimes occur. Call your health care provider if you have any problems or questions after your procedure. °What can I expect after the procedure? °· Pain and bruising at the incision sites. You will be given pain medicine to control it. °· Menopausal symptoms such as hot flashes, night sweats, and insomnia if your ovaries were removed. °· Sore throat from the breathing tube that was inserted during surgery. °Follow these instructions at home: °· Only take over-the-counter or prescription medicines for pain, discomfort, or fever as directed by your health care provider. °· Do not take aspirin. It can cause bleeding. °· Do not drive when taking pain medicine. °· Follow your health care provider's advice regarding diet, exercise, lifting, driving, and general activities. °· Resume your usual diet as directed and allowed. °· Get plenty of rest and sleep. °· Do not douche, use tampons, or have sexual intercourse for at least 6 weeks, or until your health care provider gives you permission. °· Change your bandages (dressings) as directed by your health care provider. °· Monitor your temperature and notify your health care provider of a fever. °· Take showers instead of baths for 2-3 weeks. °· Do not drink alcohol until your health care provider gives you permission. °· If you develop constipation, you may take a mild laxative with your health care provider's permission. Bran foods may help with constipation problems. Drinking enough fluids to keep your urine clear or pale yellow may help as well. °· Try to have someone home with you for 1-2 weeks to help around the house. °· Keep all of  your follow-up appointments as directed by your health care provider. °Contact a health care provider if: °· You have swelling, redness, or increasing pain around your incision sites. °· You have pus coming from your incision. °· You notice a bad smell coming from your incision. °· Your incision breaks open. °· You feel dizzy or lightheaded. °· You have pain or bleeding when you urinate. °· You have persistent diarrhea. °· You have persistent nausea and vomiting. °· You have abnormal vaginal discharge. °· You have a rash. °· You have any type of abnormal reaction or develop an allergy to your medicine. °· You have poor pain control with your prescribed medicine. °Get help right away if: °· You have chest pain or shortness of breath. °· You have severe abdominal pain that is not relieved with pain medicine. °· You have pain or swelling in your legs. °This information is not intended to replace advice given to you by your health care provider. Make sure you discuss any questions you have with your health care provider. °Document Released: 10/07/2013 Document Revised: 05/24/2016 Document Reviewed: 07/07/2013 °Elsevier Interactive Patient Education © 2017 [employer] ° °AMBULATORY SURGERY  °DISCHARGE INSTRUCTIONS ° ° °1) The drugs that you were given will stay in your system until tomorrow so for the next 24 hours you should not: ° °A) Drive an automobile °B) Make any legal decisions °C) Drink any alcoholic beverage ° ° °2) You may resume regular meals tomorrow.  Today it is better to start with liquids and gradually work up to solid foods. ° °You may eat anything you prefer, but it is better   to start with liquids, then soup and crackers, and gradually work up to solid foods. ° ° °3) Please notify your doctor immediately if you have any unusual bleeding, trouble breathing, redness and pain at the surgery site, drainage, fever, or pain not relieved by medication. ° ° ° °4) Additional Instructions: ° ° ° ° ° ° ° °Please  contact your physician with any problems or Same Day Surgery at [phone], Monday through Friday 6 am to 4 pm, or [facility] Main number at [phone]. ° °

## 2017-07-23 NOTE — Anesthesia Post-op Follow-up Note (Cosign Needed)
Anesthesia QCDR form completed.        

## 2017-07-23 NOTE — Anesthesia Preprocedure Evaluation (Signed)
Anesthesia Evaluation  Patient identified by MRN, date of birth, ID band Patient awake    Reviewed: Allergy & Precautions, H&P , NPO status , Patient's Chart, lab work & pertinent test results, reviewed documented beta blocker date and time   History of Anesthesia Complications Negative for: history of anesthetic complications  Airway Mallampati: III  TM Distance: >3 FB Neck ROM: full    Dental  (+) Dental Advidsory Given, Missing, Chipped, Teeth Intact   Pulmonary neg pulmonary ROS,           Cardiovascular Exercise Tolerance: Good negative cardio ROS       Neuro/Psych negative neurological ROS  negative psych ROS   GI/Hepatic negative GI ROS, Neg liver ROS,   Endo/Other  neg diabetesMorbid obesity  Renal/GU negative Renal ROS  negative genitourinary   Musculoskeletal   Abdominal   Peds  Hematology negative hematology ROS (+)   Anesthesia Other Findings Past Medical History: No date: Complication of anesthesia     Comment:  PT STATES LIDOCAINE WAS GIVEN DURING CONE BIOPSY AND IT               MOVED UP TO HER LIPS/FACE AND SHE FELT LIKE SHE WAS GOING              TO PASS OUT No date: PVC (premature ventricular contraction)     Comment:  NOTED ON 2015 EKG   Reproductive/Obstetrics negative OB ROS                             Anesthesia Physical Anesthesia Plan  ASA: II  Anesthesia Plan: General   Post-op Pain Management:    Induction: Intravenous  PONV Risk Score and Plan: 3 and Ondansetron, Dexamethasone and Midazolam  Airway Management Planned: Oral ETT  Additional Equipment:   Intra-op Plan:   Post-operative Plan: Extubation in OR  Informed Consent: I have reviewed the patients History and Physical, chart, labs and discussed the procedure including the risks, benefits and alternatives for the proposed anesthesia with the patient or authorized representative who has  indicated his/her understanding and acceptance.   Dental Advisory Given  Plan Discussed with: Anesthesiologist, CRNA and Surgeon  Anesthesia Plan Comments:         Anesthesia Quick Evaluation

## 2017-07-23 NOTE — Progress Notes (Signed)
Day of Surgery Procedure(s) (LRB): HYSTERECTOMY TOTAL LAPAROSCOPIC BILATERAL SALPINGECTOMY (Bilateral) CYSTOSCOPY (N/A)  Subjective: Patient reports tolerating PO, + flatus, no problems voiding and having some lower abd pains and also a headache.    Objective: I have reviewed patient's vital signs, intake and output, medications and labs.  Abd: Min T, ND Incision: clean, dry and intact Extr: no calf T, no edema  Assessment: s/p Procedure(s): HYSTERECTOMY TOTAL LAPAROSCOPIC BILATERAL SALPINGECTOMY (Bilateral) CYSTOSCOPY (N/A): stable  Plan: Advance diet Encourage ambulation Advance to PO medication Monitor headache and pain; likely to improve with overnight stay and meds and rest  LOS: 0 days    Cynthia Cooley 07/23/2017, 7:59 PM

## 2017-07-23 NOTE — Op Note (Signed)
Operative Report:  PRE-OP DIAGNOSIS: FIBROID,PELVIC PAIN   POST-OP DIAGNOSIS: FIBROID,PELVIC PAIN   PROCEDURE: Procedure(s): HYSTERECTOMY TOTAL LAPAROSCOPIC BILATERAL SALPINGECTOMY CYSTOSCOPY  SURGEON: Barnett Applebaum, MD, FACOG  ASSISTANT: Dr Glennon Mac   ANESTHESIA: General endotracheal anesthesia  ESTIMATED BLOOD LOSS: less than 100   SPECIMENS: Uterus, Tubes.  COMPLICATIONS: None  DISPOSITION: stable to PACU  FINDINGS: Intraabdominal adhesions were not noted. Large fundal fibroid. Normal ovaries.  PROCEDURE:  The patient was taken to the OR where anesthesia was administed. She was prepped and draped in the normal sterile fashion in the dorsal lithotomy position in the [place] stirrups. A time out was performed. A Graves speculum was inserted, the cervix was grasped with a single tooth tenaculum and the endometrial cavity was sounded. The cervix was progressively dilated to a size 18 [place] with [employer] dilators. A V-Care uterine manipulator was inserted in the usual fashion without incident. Gloves were changed and attention was turned to the abdomen.   An infraumbilical transverse 83mm skin incision was made with the scalpel after local anesthesia applied to the skin. A Veress-step needle was inserted in the usual fashion and confirmed using the hanging drop technique. A pneumoperitoneum was obtained by insufflation of CO2 (opening pressure of 45mmHg) to 65mmHg. A diagnostic laparoscopy was performed yielding the previously described findings. Attention was turned to the left lower quadrant where after visualization of the inferior epigastric vessels a 61mm skin incision was made with the scalpel. A 5 mm laparoscopic port was inserted. The same procedure was repeated in the right lower quadrant with a 62mm trocar. Attention was turned to the left aspect of the uterus, where after visualization of the ureter, the round ligament was coagulated and transected using the 43mm Harmonic Scapel. The  anterior and posterior leafs of the broad ligament were dissected off as the anterior one was coagulated and transected in a caudal direction towards the cuff of the uterine manipulator.  Attention was then turned to the left fallopian tube which was recognized by visualization of the fimbria. The tube is excised to its attachment to the uterus. The uterine-ovarian ligament and its blood vessels were carefully coagulated and transected using the Harmonic scapel.  Attention was turned to the right aspect of the uterus where the same procedure was performed.  The vesicouterine reflection of the peritoneum was dissected with the harmonic scapel and the bladder flap was created bluntly.  The uterine vessels were coagulated and transected bilaterally using first bipolar cautery and then the harmonic scapel. A 360 degree, circumferential colpotomy was done to completely amputate the uterus with cervix and tubes.   As the uterus was too large to remove through the vagina and its shape and size did not lend well to vaginal morcellation, decision was made to perform laparoscopic morcellation, after closure of the cuff and cystoscopy.  The colpotomy was repaired in a simple interrupted fashion using a 0-Polysorb suture with an endo-stitch device.  Vaginal exam confirms complete closure.  The cavity was copiously irrigated. A survey of the pelvic cavity revealed adequate hemostasis and no injury to bowel, bladder, or ureter.   A diagnostic cystoscopy was performed using saline distension of bladder with no lesions or injuries noted.  Bilateral urine flow from each ureteral orifice is visualized (dye given IV to assist with this visualization.  The RLQ incision is extended slightly and the laparoscopic morcellator device is placed.  The uterus, tubes and cervix is then removed under careful surveillance using the morcellator device.  All tissue  visualized is removed.  Pelvic cavity is then irrigated with aspiration of  fluid.  The RLQ fascia is then closed with a Vicryl Suture using the fascia closure device.  At this point the procedure was finalized. All the instruments were removed from the patient's body. Gas was expelled and patient is leveled.  Incisions are closed with skin adhesive.    Patient goes to recovery room in stable condition.  All sponge, instrument, and needle counts are correct x2.     Barnett Applebaum, MD, [facility], [facility] 07/23/2017  12:23 PM

## 2017-07-23 NOTE — Anesthesia Procedure Notes (Signed)
Procedure Name: Intubation Date/Time: 07/23/2017 10:00 AM Performed by: Aline Brochure Pre-anesthesia Checklist: Patient identified, Emergency Drugs available, Suction available and Patient being monitored Patient Re-evaluated:Patient Re-evaluated prior to induction Oxygen Delivery Method: Circle system utilized Preoxygenation: Pre-oxygenation with 100% oxygen Induction Type: IV induction Ventilation: Mask ventilation without difficulty Laryngoscope Size: Mac and 3 Grade View: Grade II Tube type: Oral Tube size: 7.0 mm Number of attempts: 1 Airway Equipment and Method: Stylet Placement Confirmation: ETT inserted through vocal cords under direct vision,  positive ETCO2 and breath sounds checked- equal and bilateral Secured at: 21 cm Tube secured with: Tape Dental Injury: Teeth and Oropharynx as per pre-operative assessment

## 2017-07-23 NOTE — H&P (Signed)
HPI: Uterine Fibroids Patient presents with uterine fibroids. Periods are regular every 28-30 days, lasting 5 days. Dysmenorrhea:severe, occurring throughout menses. Cyclic symptoms include bloating. No intermenstrual bleeding, spotting, or discharge.  Ultrasound demonstrates 7 cm fibroid These findings are Pelvis abnormal as fibroidis present and attributal to sx's  PMHx: She  has no past medical history on file. Also,  has a past surgical history that includes Cervical cone biopsy (1989, 2005)., family history includes Cancer in her mother; Diabetes in her mother; Glaucoma in her father; Hyperlipidemia in her mother.,  reports that she has never smoked. She has never used smokeless tobacco. She reports that she does not drink alcohol or use drugs.  She currently has no medications in their medication list. Also, has No Known Allergies.  Review of Systems  Constitutional: Negative for chills, fever and malaise/fatigue.  HENT: Negative for congestion, sinus pain and sore throat.   Eyes: Negative for blurred vision and pain.  Respiratory: Negative for cough and wheezing.   Cardiovascular: Negative for chest pain and leg swelling.  Gastrointestinal: Negative for abdominal pain, constipation, diarrhea, heartburn, nausea and vomiting.  Genitourinary: Negative for dysuria, frequency, hematuria and urgency.  Musculoskeletal: Negative for back pain, joint pain, myalgias and neck pain.  Skin: Negative for itching and rash.  Neurological: Negative for dizziness, tremors and weakness.  Endo/Heme/Allergies: Does not bruise/bleed easily.  Psychiatric/Behavioral: Negative for depression. The patient is not nervous/anxious and does not have insomnia.    Objective: BP 125/83   Pulse 83   Temp (!) 97 F (36.1 C) (Tympanic)   Resp 16   Ht 5\' 6"  (1.676 m)   Wt 240 lb (108.9 kg)   LMP 07/18/2017   SpO2 100%   BMI 38.74 kg/m  Physical examination Constitutional NAD, Conversant  HEENT  Atraumatic; Op clear with mmm.  Normo-cephalic. Pupils reactive. Anicteric sclerae  Thyroid/Neck Smooth without nodularity or enlargement. Normal ROM.  Neck Supple.  Skin No rashes, lesions or ulceration. Normal palpated skin turgor. No nodularity.  Lungs: Clear to auscultation.No rales or wheezes. Normal Respiratory effort, no retractions.  Heart: NSR.  No murmurs or rubs appreciated. No periferal edema  Abdomen: Soft.  Non-tender.  No masses.  No HSM. No hernia  Extremities: Moves all appropriately.  Normal ROM for age. No lymphadenopathy.  Neuro: Grossly intact  Psych: Oriented to PPT.  Normal mood. Normal affect.     Pelvic:   Vulva: Normal appearance.  No lesions.  Vagina: No lesions or abnormalities noted.  Support: Normal pelvic support.  Urethra No masses tenderness or scarring.  Meatus Normal size without lesions or prolapse.  Cervix: Normal appearance.  No lesions.  Anus: Normal exam.  No lesions.  Perineum: Normal exam.  No lesions.        Bimanual   Uterus: Enlarged size.  Non-tender.  Mobile.  Adnexae: No masses.  Non-tender to palpation.  Cul-de-sac: Negative for abnormality.   Assessment:  Intramural leiomyoma of uterus  Options of myomectomy, [place], Lupron, hormonal tx of sx's all discussed w patient. Info gv.  She desires to have TLH, BS done w preservation of ovaries.  I have had a careful discussion with this patient about all the options available and the risk/benefits of each. I have fully informed this patient that a hysterectomy may subject her to a variety of discomforts and risks: She understands that most patients have surgery with little difficulty, but problems can happen ranging from minor to fatal. These include nausea, vomiting, pain, bleeding, infection,  poor healing, hernia, or formation of adhesions. Unexpected reactions may occur from any drug or anesthetic given. Unintended injury may occur to other pelvic or abdominal structures such as Fallopian tubes,  ovaries, bladder, ureter (tube from kidney to bladder), or bowel. Nerves going from the pelvis to the legs may be injured. Any such injury may require immediate or later additional surgery to correct the problem. Excessive blood loss requiring transfusion is very unlikely but possible. Dangerous blood clots may form in the legs or lungs. Physical and sexual activity will be restricted in varying degrees for an indeterminate period of time but most often 2-4 weeks. She understands that the plan is to do this laparoscopically, however, there is a chance that this will need to be performed via a larger incision. She will most likely be hospitalized overnight. Finally, she understands that it is impossible to list every possible undesirable effect and that the condition for which surgery is done is not always cured or significantly improved, and in rare cases may be even worse. I have also counseled her extensively about the pros and cons of ovarian conservation versus removal. Ample time was given to answer all questions.  History and Physical Interval Note:  07/23/2017 9:03 AM  Briant Cedar  has presented today for surgery, with the diagnosis of [facility] PAIN  The various methods of treatment have been discussed with the patient and family. After consideration of risks, benefits and other options for treatment, the patient has consented to  Procedure(s): HYSTERECTOMY TOTAL LAPAROSCOPIC BILATERAL SALPINGECTOMY (Bilateral) as a surgical intervention .  The patient's history has been reviewed, patient examined, no change in status, stable for surgery.  Pt has the following beta blocker history-  Not taking Beta Blocker.  I have reviewed the patient's chart and labs.  Questions were answered to the patient's satisfaction.    Hoyt Koch   07/23/2017 9:03 AM

## 2017-07-24 ENCOUNTER — Encounter: Payer: Self-pay | Admitting: Obstetrics & Gynecology

## 2017-07-24 DIAGNOSIS — D251 Intramural leiomyoma of uterus: Secondary | ICD-10-CM | POA: Diagnosis not present

## 2017-07-24 MED ORDER — OXYCODONE-ACETAMINOPHEN 5-325 MG PO TABS
1.0000 | ORAL_TABLET | ORAL | 0 refills | Status: AC | PRN
Start: 2017-07-24 — End: ?

## 2017-07-24 NOTE — Discharge Summary (Signed)
Gynecology Physician Postoperative Discharge Summary  Patient ID: Cynthia Cooley MRN: [id number] DOB/AGE: 07/11/1971 46 y.o.  Admit Date: 07/23/2017 Discharge Date: 07/24/2017  Preoperative Diagnoses: Fibroids and Pain  Procedures: Procedure(s) (LRB): HYSTERECTOMY TOTAL LAPAROSCOPIC BILATERAL SALPINGECTOMY (Bilateral) CYSTOSCOPY (N/A)  Significant Labs: CBC Latest Ref Rng & Units 07/22/2017  WBC 3.6 - 11.0 K/uL 2.3(L)  Hemoglobin 12.0 - 16.0 g/dL 12.2  Hematocrit 35.0 - 47.0 % 36.1  Platelets 150 - 440 K/uL 188    Hospital Course:  Cynthia Cooley is a 46 y.o. [id number]  admitted for scheduled surgery.  She underwent the procedures as mentioned above, her operation was uncomplicated. For further details about surgery, please refer to the operative report. Patient had an uncomplicated postoperative course. By time of discharge on POD#1, her pain was controlled on oral pain medications; she was ambulating, voiding without difficulty, tolerating regular diet and passing flatus. She was deemed stable for discharge to home.   Discharge Exam: Blood pressure 137/71, pulse 84, temperature 98.7 F (37.1 C), temperature source Oral, resp. rate 18, height 5\' 6"  (1.676 m), weight 240 lb (108.9 kg), last menstrual period 07/18/2017, SpO2 99 %. General appearance: alert and no distress  Resp: clear to auscultation bilaterally  Cardio: regular rate and rhythm  GI: soft, non-tender; bowel sounds normal; no masses, no organomegaly.  Incision: C/D/I, no erythema, no drainage noted Pelvic: scant blood on pad  Extremities: extremities normal, atraumatic, no cyanosis or edema and Homans sign is negative, no sign of DVT  Discharged Condition: Stable  Disposition: Final discharge disposition not confirmed  Discharge Instructions    Call MD for:  persistant nausea and vomiting    Complete by:  As directed    Call MD for:  redness, tenderness, or signs of infection (pain, swelling, redness, odor or  green/yellow discharge around incision site)    Complete by:  As directed    Call MD for:  severe uncontrolled pain    Complete by:  As directed    Call MD for:  temperature >100.4    Complete by:  As directed    Change dressing (specify)    Complete by:  As directed    Dressing change: remove any dressings tomorrow   Diet general    Complete by:  As directed    Discharge instructions    Complete by:  As directed    Resume activities according to discharge instruction sheets   Increase activity slowly    Complete by:  As directed      Allergies as of 07/24/2017      Reactions   Lidocaine Other (See Comments)   THIS WAS GIVEN AS A LOCAL ANESTHETIC FOR CONE BIOPSY-PT STATES IT  MADE HER FACE/LIPS NUMB AND FELT LIKE SHE WAS GOING TO PASS OUT      Medication List    TAKE these medications   oxyCODONE-acetaminophen 5-325 MG tablet Commonly known as:  PERCOCET/ROXICET Take 1-2 tablets by mouth every 6 (six) hours as needed.   oxyCODONE-acetaminophen 5-325 MG tablet Commonly known as:  PERCOCET/ROXICET Take 1-2 tablets by mouth every 4 (four) hours as needed (moderate to severe pain (when tolerating fluids)).      Follow-up Information    Gae Dry, MD. Go in 2 week(s).   Specialty:  Obstetrics and Gynecology Why:  as scheduled Contact information: [place] [address] [phone]           Barnett Applebaum, MD, [facility], [facility] 07/24/2017  7:18 AM

## 2017-07-24 NOTE — Progress Notes (Signed)
Lab tech in room to draw "morning labs" (HGB); pt wanted to know why and "what's it for?"; RN in room to discuss the blood draw is how the doctor can see how you're body is adjusting to blood loss from surgery; pt said "ok, but I need to brush my teeth and wash up before I start talking to people"; RN assisted pt to bathroom to void; RN then assisted pt as she got started with brushing teeth and doing a "wash up" at the bedside sink; pt is ok with lab returning around 0730 (lab tech has to get a few other labs at that time) to get her blood draw

## 2017-07-24 NOTE — Progress Notes (Signed)
Patient understands all discharge instructions and the need to attend follow up appointments. Provided patient with antibacterial kit with instructions on how to use. Patient discharge via wheelchair with auxillary.

## 2017-07-25 ENCOUNTER — Telehealth: Payer: Self-pay

## 2017-07-25 ENCOUNTER — Ambulatory Visit: Payer: Self-pay | Admitting: Obstetrics & Gynecology

## 2017-07-25 LAB — SURGICAL PATHOLOGY

## 2017-07-25 NOTE — Telephone Encounter (Signed)
-----   Message from Gae Dry, MD sent at 07/25/2017  8:10 AM EDT ----- Regarding: post op Please call and see how she is doing after surgery Tues and discharge from [place] (she had hysterectomy). Thx.

## 2017-07-25 NOTE — Telephone Encounter (Signed)
Pt states she is doing well, no problems.

## 2017-07-26 LAB — TYPE AND SCREEN
ABO/RH(D): AB POS
Antibody Screen: NEGATIVE

## 2017-07-29 ENCOUNTER — Telehealth: Payer: Self-pay

## 2017-07-29 NOTE — Telephone Encounter (Signed)
Pt had surg Tues, went shopping the other day, used a scooter which was a bumpy ride.  She was sore after that and still is.  Today she is seeing pink when wipes after urinating.  Normal?  Also, she wants to go back to work on the 7.  Can she mover her appt to the 6th?  [phone]

## 2017-07-29 NOTE — Telephone Encounter (Signed)
lmtrc

## 2017-07-31 NOTE — Telephone Encounter (Signed)
There are no cancellations at this time please advise work in.

## 2017-07-31 NOTE — Telephone Encounter (Signed)
Pt aware.

## 2017-07-31 NOTE — Telephone Encounter (Signed)
Routing to [place].[facility]

## 2017-07-31 NOTE — Telephone Encounter (Signed)
Please advise 

## 2017-07-31 NOTE — Telephone Encounter (Signed)
Pt states she is still having spotting very light pink when she urinates please advise if pt should be seen

## 2017-07-31 NOTE — Telephone Encounter (Signed)
Pt is still having spotting, could AMS see her? is he here tomorrow

## 2017-07-31 NOTE — Telephone Encounter (Signed)
Some light bleeding is expected because of the vaginal incision following the hysterectomy

## 2017-08-05 ENCOUNTER — Telehealth: Payer: Self-pay

## 2017-08-05 NOTE — Telephone Encounter (Signed)
Pt is calling back needing a work note so she will be able return back to work. Please advise

## 2017-08-05 NOTE — Telephone Encounter (Signed)
Pt had surgery 07/23/17 and requesting to go back to work tomorrow. Would like to pick up a note today if possible. Advised pt that she has post op appt on Friday 8/10 and RPH on call but I would send a message. Please advise. Thank you.

## 2017-08-06 ENCOUNTER — Encounter: Payer: Self-pay | Admitting: Obstetrics & Gynecology

## 2017-08-06 NOTE — Telephone Encounter (Signed)
Pt aware letter ready for her to pick up at front desk.

## 2017-08-06 NOTE — Telephone Encounter (Signed)
Letter done for you to print for patient. Ok to return to work after 2 weeks form surgery. I presume she also has appt this week.

## 2017-08-09 ENCOUNTER — Ambulatory Visit: Payer: Self-pay | Admitting: Obstetrics & Gynecology

## 2017-10-28 ENCOUNTER — Telehealth: Payer: Self-pay

## 2017-10-28 NOTE — Telephone Encounter (Signed)
Pt has surg end of July.  Her supervisor has assigned her to a quadraplegic which requires lifting dead weight.  She's been doing this for two weeks and is feeling sore where incisions were made.  She doesn't feel like it's good for her to lift over 5-10 pounds. Would like doctor's note to explain that she can't lift but a certain weight.  [phone]

## 2017-10-29 NOTE — Telephone Encounter (Signed)
Pt aware rph out of office this week will let her know something asap

## 2017-11-03 NOTE — Telephone Encounter (Signed)
Nicely explain that there are no risks to lifting this far out from surgery, that she may be feeling some pulling sensations but they are not injurious or of concern to her healed scars.

## 2020-09-22 ENCOUNTER — Telehealth: Payer: Self-pay | Admitting: Internal Medicine

## 2020-09-22 NOTE — Telephone Encounter (Signed)
Called pt told her that we do not do this in office or send in script. Gave pt in information of where to go in [employer]. [facility]) Pt stated that [facility] was too far and that she did not want to travel that far. Tried to give her the [facility] location pt said that  her daughter was going to the place in [place] that she would get the information from her.   KP

## 2020-09-22 NOTE — Telephone Encounter (Signed)
Copied from [place] [phone]. Topic: Quick Communication - See Telephone Encounter >> Sep 22, 2020  4:05 PM [place] wrote: CRM for notification. See Telephone encounter for: 09/22/20. Pt is going to [place] and was told needs a malaria shot but also told that all she needed was a script if she gets malaria. Pls fu with [phone]

## 2020-10-27 ENCOUNTER — Telehealth: Payer: Self-pay

## 2020-10-27 NOTE — Telephone Encounter (Signed)
Copied from [place] [id number]. Topic: General - Other >> Oct 27, 2020  1:19 PM Keene Breath wrote: Reason for CRM: Patient called to ask if she could get some pain medication because she will be traveling on Sunday for 15 hrs on the plane, and she usually has severe pain in her ears because of her sickle cell trait.  She stated that traveling in the air that long will be unbearable and would like to know if there is something she can take or get prescribed.  Please advise and call patient to discuss at [phone]

## 2020-10-28 NOTE — Telephone Encounter (Signed)
FYI Pt is calling back to let dr berglund know her daughter will no longer be her patient. Pt would not give me her daughter name. Pt is aware after 3 yrs would need to re-est
